# Patient Record
Sex: Male | Born: 2000 | Race: Black or African American | Hispanic: No | Marital: Single | Smoking: Never smoker
Health system: Southern US, Community
[De-identification: ages and names within clinical notes are randomized; demographics above are authoritative.]

## PROBLEM LIST (undated history)

## (undated) HISTORY — PX: APPENDECTOMY: SHX54

---
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
## 2020-12-10 IMAGING — DX DG CHEST 2V
2 series · 2 of 2 positions shown · non-contrast
Comparison: Abdominal CT 11/01/2017.

CLINICAL DATA: Physical examination. Nonsmoker.

EXAM:
CHEST - 2 VIEW

[chest pa]
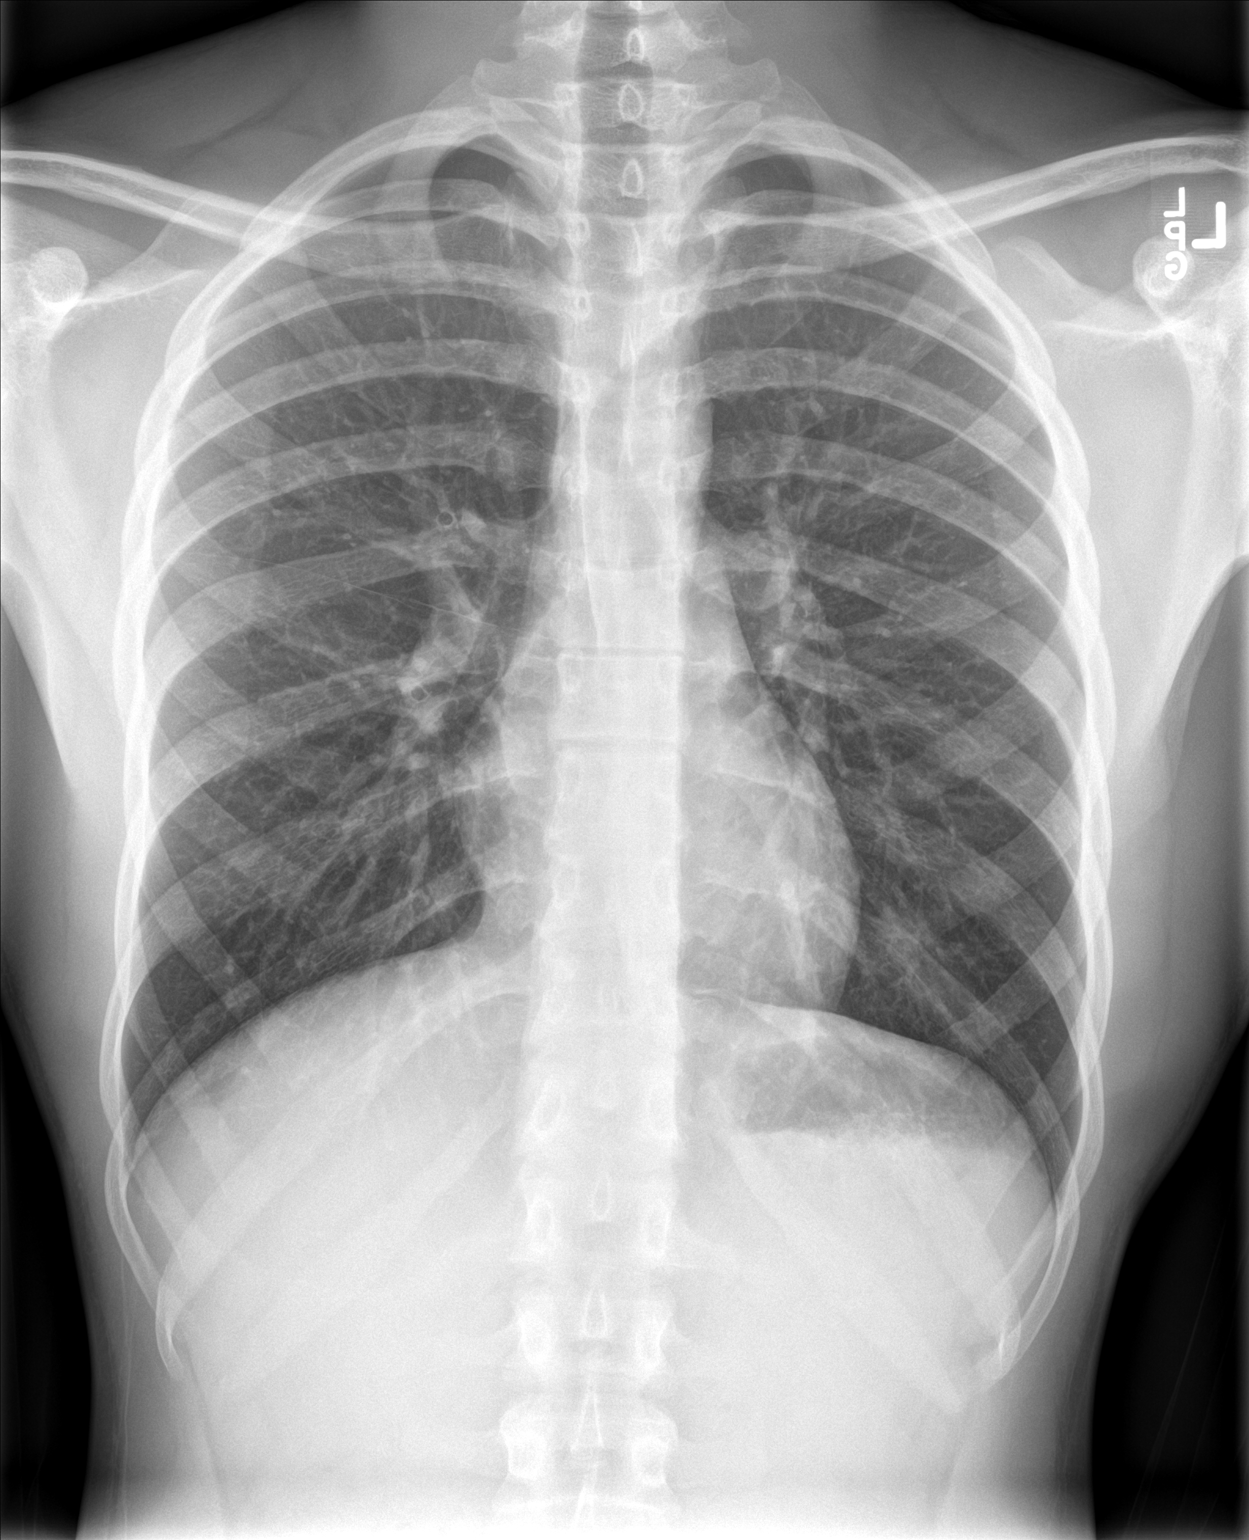

[chest lat]
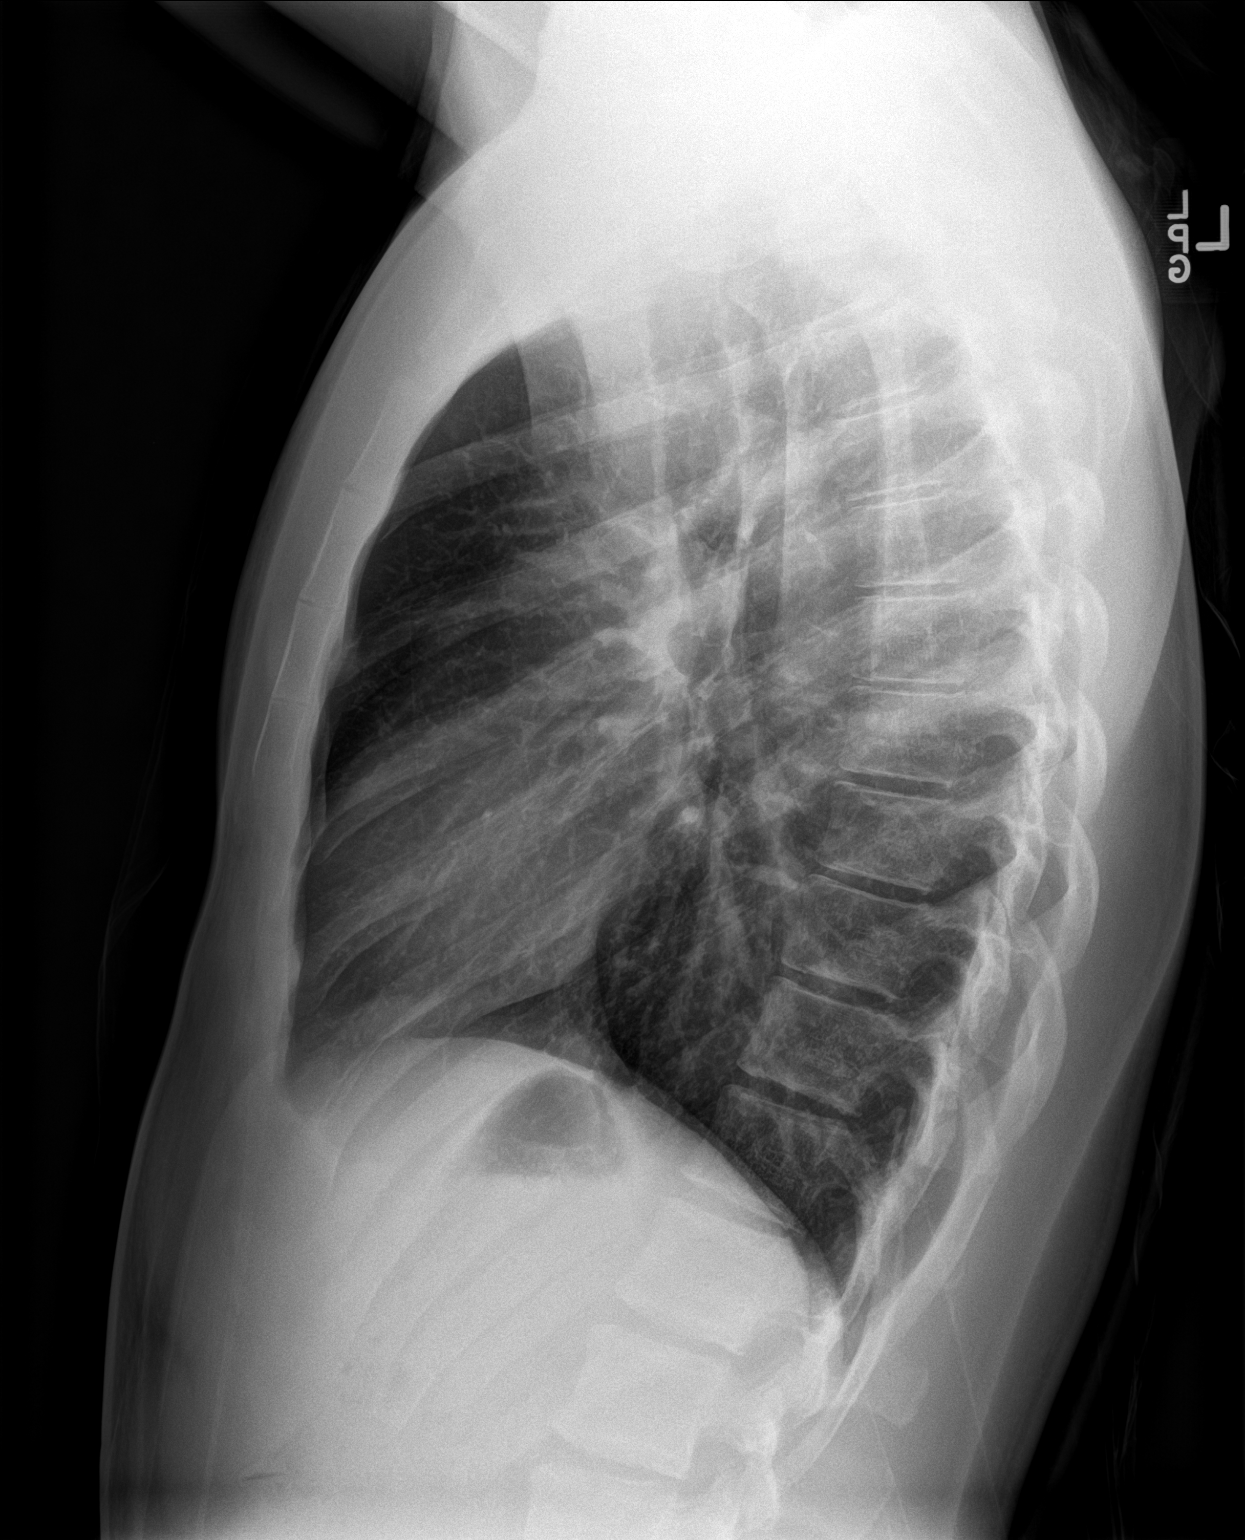

[2 of 2 positions shown; findings below may reference images not displayed]

FINDINGS: The heart size and mediastinal contours are normal. The lungs are
clear. There is no pleural effusion or pneumothorax. No acute
osseous findings are identified.
IMPRESSION: No active cardiopulmonary process.

## 2022-12-26 ENCOUNTER — Ambulatory Visit
Admission: EM | Admit: 2022-12-26 | Discharge: 2022-12-26 | Disposition: A | Discharge status: Home / Self Care | Payer: Managed Care, Other (non HMO) | Attending: Family Medicine | Admitting: Family Medicine

## 2022-12-26 ENCOUNTER — Other Ambulatory Visit: Payer: Self-pay

## 2022-12-26 DIAGNOSIS — M940 Chondrocostal junction syndrome [Tietze]: Secondary | ICD-10-CM | POA: Diagnosis not present

## 2022-12-26 NOTE — Discharge Instructions (Addendum)
May take Ibuprofen 200mg , 4 tabs every 8 hours with food for sternum pain. Apply ice pack for 20 to 30 minutes, 2 to 3 times daily  Continue until pain decreases.  Avoid heavy lifting and increased reps when working out until improved.  If symptoms become significantly worse during the night or over the weekend, proceed to the local emergency room.

## 2022-12-26 NOTE — ED Triage Notes (Signed)
Reports bp elevated in 150's yesterday. Has been having chest tightness and sharp pains this morning. No nausea, vomiting, diaphoresis.

## 2022-12-26 NOTE — ED Provider Notes (Signed)
[facility]    CSN: [id number] Arrival date & time: 12/26/22  0830      History   Chief Complaint Chief Complaint  Patient presents with  . Hypertension  . Chest Pain    HPI [name] is a 22 y.o. male.    Hypertension Associated symptoms include chest pain.  Chest Pain   History reviewed. No pertinent past medical history.  Patient Active Problem List   Diagnosis Date Noted  . Acute appendicitis 11/01/2017    Past Surgical History:  Procedure Laterality Date  . APPENDECTOMY    . LAPAROSCOPIC APPENDECTOMY N/A 11/02/2017   Procedure: APPENDECTOMY LAPAROSCOPIC;  Surgeon: Glenna Fellows, MD;  Location: [facility];  Service: General;  Laterality: N/A;       Home Medications    Prior to Admission medications   Medication Sig Start Date End Date Taking? Authorizing Provider  acetaminophen (TYLENOL 8 HOUR) 650 MG CR tablet Take 1 tablet (650 mg total) by mouth every 8 (eight) hours as needed for pain. 07/08/19   Mannie Stabile, PA-C  acetaminophen (TYLENOL) 500 MG tablet You can take 1000 mg every 6 hours as needed for pain.  You can buy this over the counter at any drug store.  DO NOT TAKE MORE THAN 4000 MG OF TYLENOL(ACETAMINOPHEN) PER DAY.  IT CAN HARM YOUR LIVER. 11/02/17   Sherrie George, PA-C  codeine 15 MG tablet He can take 1 tablet every 6 hours as needed for pain not relieved by plain Tylenol and ibuprofen. 11/03/17   Sherrie George, PA-C  ibuprofen (ADVIL,MOTRIN) 200 MG tablet You can take 2 to 3 tablets every 6 hours as needed for pain.  Alternate with plain Tylenol.  Do not exceed this limit.  You can buy this over-the-counter at any drugstore. 11/03/17   Sherrie George, PA-C  methocarbamol (ROBAXIN) 500 MG tablet Take 1 tablet (500 mg total) by mouth 2 (two) times daily. 07/08/19   Mannie Stabile, PA-C    Family History History reviewed. No pertinent family history.  Social History Social History   Tobacco Use  .  Smoking status: Never  . Smokeless tobacco: Never  Substance Use Topics  . Alcohol use: Never  . Drug use: Never     Allergies   Patient has no known allergies.   Review of Systems Review of Systems  Cardiovascular:  Positive for chest pain.     Physical Exam Triage Vital Signs ED Triage Vitals  Encounter Vitals Group     BP 12/26/22 0837 126/77     Systolic BP Percentile --      Diastolic BP Percentile --      Pulse Rate 12/26/22 0837 66     Resp 12/26/22 0837 16     Temp 12/26/22 0837 98.1 F (36.7 C)     Temp Source 12/26/22 0837 Oral     SpO2 12/26/22 0837 99 %     Weight --      Height --      Head Circumference --      Peak Flow --      Pain Score 12/26/22 0842 4     Pain Loc --      Pain Education --      Exclude from Growth Chart --    No data found.  Updated Vital Signs BP 126/77   Pulse 66   Temp 98.1 F (36.7 C) (Oral)   Resp 16   SpO2 99%   Visual Acuity  Right Eye Distance:   Left Eye Distance:   Bilateral Distance:    Right Eye Near:   Left Eye Near:    Bilateral Near:     Physical Exam Chest:      UC Treatments / Results  Labs (all labs ordered are listed, but only abnormal results are displayed) Labs Reviewed - No data to display  EKG   Radiology No results found.  Procedures Procedures (including critical care time)  Medications Ordered in UC Medications - No data to display  Initial Impression / Assessment and Plan / UC Course  I have reviewed the triage vital signs and the nursing notes.  Pertinent labs & imaging results that were available during my care of the patient were reviewed by me and considered in my medical decision making (see chart for details).     *** Final Clinical Impressions(s) / UC Diagnoses   Final diagnoses:  Costochondritis     Discharge Instructions      May take Ibuprofen 200mg , 4 tabs every 8 hours with food for sternum pain. Apply ice pack for 20 to 30 minutes, 2 to 3 times  daily  Continue until pain decreases.  Avoid heavy lifting and increased reps when working out until improved.  If symptoms become significantly worse during the night or over the weekend, proceed to the local emergency room.    ED Prescriptions   None    PDMP not reviewed this encounter.

## 2022-12-26 NOTE — ED Notes (Signed)
Pt decided against CXR ordered by Dr Cathren Harsh

## 2023-03-17 ENCOUNTER — Other Ambulatory Visit: Payer: Self-pay

## 2023-03-17 ENCOUNTER — Ambulatory Visit
Admission: EM | Admit: 2023-03-17 | Discharge: 2023-03-17 | Disposition: A | Discharge status: Home / Self Care | Payer: Managed Care, Other (non HMO) | Attending: Family Medicine | Admitting: Family Medicine

## 2023-03-17 DIAGNOSIS — H6503 Acute serous otitis media, bilateral: Secondary | ICD-10-CM | POA: Diagnosis not present

## 2023-03-17 MED ORDER — AMOXICILLIN 875 MG PO TABS: 875.0000 mg | ORAL_TABLET | Freq: Two times a day (BID) | ORAL | 0 refills | Status: AC

## 2023-03-17 NOTE — Discharge Instructions (Signed)
Use your Flonase daily until your symptoms have resolved make sure you are drinking lots of water Take amoxicillin 2 times a day for a week

## 2023-03-17 NOTE — ED Provider Notes (Signed)
[facility]    CSN: [id number] Arrival date & time: 03/17/23  1026      History   Chief Complaint Chief Complaint  Patient presents with   facial congestion    Otalgia    HPI Adam Fitzgerald is a 23 y.o. male.   HPI  Patient states that he has had a cold for the last week or so.  He has a lot of sinus and facial congestion.  He has now developed pressure and pain in his ears.  Concern for ear infection.  He has been taking over-the-counter medicine.  History reviewed. No pertinent past medical history.  Patient Active Problem List   Diagnosis Date Noted   Acute appendicitis 11/01/2017    Past Surgical History:  Procedure Laterality Date   APPENDECTOMY     LAPAROSCOPIC APPENDECTOMY N/A 11/02/2017   Procedure: APPENDECTOMY LAPAROSCOPIC;  Surgeon: Glenna Fellows, MD;  Location: [facility];  Service: General;  Laterality: N/A;       Home Medications    Prior to Admission medications   Medication Sig Start Date End Date Taking? Authorizing Provider  amoxicillin (AMOXIL) 875 MG tablet Take 1 tablet (875 mg total) by mouth 2 (two) times daily. 03/17/23  Yes Eustace Moore, MD  acetaminophen (TYLENOL 8 HOUR) 650 MG CR tablet Take 1 tablet (650 mg total) by mouth every 8 (eight) hours as needed for pain. 07/08/19   Mannie Stabile, PA-C  acetaminophen (TYLENOL) 500 MG tablet You can take 1000 mg every 6 hours as needed for pain.  You can buy this over the counter at any drug store.  DO NOT TAKE MORE THAN 4000 MG OF TYLENOL(ACETAMINOPHEN) PER DAY.  IT CAN HARM YOUR LIVER. 11/02/17   Sherrie George, PA-C  ibuprofen (ADVIL,MOTRIN) 200 MG tablet You can take 2 to 3 tablets every 6 hours as needed for pain.  Alternate with plain Tylenol.  Do not exceed this limit.  You can buy this over-the-counter at any drugstore. 11/03/17   Sherrie George, PA-C    Family History Family History  Problem Relation Age of Onset   Healthy Mother    Healthy Father      Social History Social History   Tobacco Use   Smoking status: Never   Smokeless tobacco: Never  Substance Use Topics   Alcohol use: Never   Drug use: Never     Allergies   Patient has no known allergies.   Review of Systems Review of Systems See HPI  Physical Exam Triage Vital Signs ED Triage Vitals  Encounter Vitals Group     BP 03/17/23 1039 (!) 153/83     Systolic BP Percentile --      Diastolic BP Percentile --      Pulse Rate 03/17/23 1039 68     Resp 03/17/23 1039 16     Temp 03/17/23 1039 97.6 F (36.4 C)     Temp src --      SpO2 03/17/23 1039 98 %     Weight --      Height --      Head Circumference --      Peak Flow --      Pain Score 03/17/23 1038 0     Pain Loc --      Pain Education --      Exclude from Growth Chart --    No data found.  Updated Vital Signs BP (!) 153/83   Pulse 68   Temp 97.6  F (36.4 C)   Resp 16   SpO2 98%      Physical Exam Constitutional:      General: He is not in acute distress.    Appearance: He is well-developed. He is obese.  HENT:     Head: Normocephalic and atraumatic.     Ears:     Comments: Both TMs are retracted and injected    Nose: Rhinorrhea present.     Mouth/Throat:     Pharynx: Posterior oropharyngeal erythema present.  Eyes:     Conjunctiva/sclera: Conjunctivae normal.     Pupils: Pupils are equal, round, and reactive to light.  Cardiovascular:     Rate and Rhythm: Normal rate.     Heart sounds: Normal heart sounds.  Pulmonary:     Effort: Pulmonary effort is normal. No respiratory distress.     Breath sounds: Normal breath sounds.  Abdominal:     General: There is no distension.     Palpations: Abdomen is soft.  Musculoskeletal:        General: Normal range of motion.     Cervical back: Normal range of motion.  Lymphadenopathy:     Cervical: Cervical adenopathy present.  Skin:    General: Skin is warm and dry.  Neurological:     Mental Status: He is alert.      UC  Treatments / Results  Labs (all labs ordered are listed, but only abnormal results are displayed) Labs Reviewed - No data to display  EKG   Radiology No results found.  Procedures Procedures (including critical care time)  Medications Ordered in UC Medications - No data to display  Initial Impression / Assessment and Plan / UC Course  I have reviewed the triage vital signs and the nursing notes.  Pertinent labs & imaging results that were available during my care of the patient were reviewed by me and considered in my medical decision making (see chart for details).     Final Clinical Impressions(s) / UC Diagnoses   Final diagnoses:  Bilateral acute serous otitis media, recurrence not specified     Discharge Instructions      Use your Flonase daily until your symptoms have resolved make sure you are drinking lots of water Take amoxicillin 2 times a day for a week     ED Prescriptions     Medication Sig Dispense Auth. Provider   amoxicillin (AMOXIL) 875 MG tablet Take 1 tablet (875 mg total) by mouth 2 (two) times daily. 14 tablet Eustace Moore, MD      PDMP not reviewed this encounter.   Eustace Moore, MD 03/17/23 1524
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
